# Patient Record
Sex: Female | Born: 1970 | Race: White | Hispanic: No | Marital: Married
Health system: Midwestern US, Community
[De-identification: ages and names within clinical notes are randomized; demographics above are authoritative.]

## PROBLEM LIST (undated history)

## (undated) DIAGNOSIS — Z8489 Family history of other specified conditions: Secondary | ICD-10-CM

## (undated) DIAGNOSIS — I10 Essential (primary) hypertension: Secondary | ICD-10-CM

## (undated) HISTORY — PX: COLONOSCOPY: SHX174

---

## 1997-12-24 ENCOUNTER — Other Ambulatory Visit: Admission: RE | Admit: 1997-12-24 | Discharge: 1997-12-24 | Payer: Self-pay | Admitting: *Deleted

## 1999-01-20 ENCOUNTER — Other Ambulatory Visit: Admission: RE | Admit: 1999-01-20 | Discharge: 1999-01-20 | Payer: Self-pay | Admitting: *Deleted

## 2000-03-07 ENCOUNTER — Other Ambulatory Visit: Admission: RE | Admit: 2000-03-07 | Discharge: 2000-03-07 | Payer: Self-pay | Admitting: *Deleted

## 2001-04-02 ENCOUNTER — Other Ambulatory Visit: Admission: RE | Admit: 2001-04-02 | Discharge: 2001-04-02 | Payer: Self-pay | Admitting: *Deleted

## 2002-04-23 ENCOUNTER — Other Ambulatory Visit: Admission: RE | Admit: 2002-04-23 | Discharge: 2002-04-23 | Payer: Self-pay | Admitting: *Deleted

## 2003-05-01 ENCOUNTER — Other Ambulatory Visit: Admission: RE | Admit: 2003-05-01 | Discharge: 2003-05-01 | Payer: Self-pay | Admitting: *Deleted

## 2004-06-02 ENCOUNTER — Other Ambulatory Visit: Admission: RE | Admit: 2004-06-02 | Discharge: 2004-06-02 | Payer: Self-pay | Admitting: *Deleted

## 2006-06-12 ENCOUNTER — Other Ambulatory Visit: Admission: RE | Admit: 2006-06-12 | Discharge: 2006-06-12 | Payer: Self-pay | Admitting: *Deleted

## 2006-06-23 ENCOUNTER — Encounter: Admission: RE | Admit: 2006-06-23 | Discharge: 2006-06-23 | Payer: Self-pay | Admitting: *Deleted

## 2007-11-27 IMAGING — CT CT ABDOMEN W/ CM
2 of 5 series · 16 of 46 positions shown, 18 images · IV contrast (READICAT/WATER & [ID] OMNI 300)
Comparison: No prior exam.

CLINICAL DATA: Left ovarian mass seen on ultrasound.
TECHNIQUE: Multidetector CT imaging of the abdomen and pelvis was performed
following the standard protocol during bolus administration of intravenous
contrast.

Contrast:  125 cc Omnipaque 300

[Series 3: routine abdomen · axial · 0.78mm/px · z∈[-382,-6]mm · 13 of 82 slices shown, 15 images]
[im 5/82  soft-tissue]
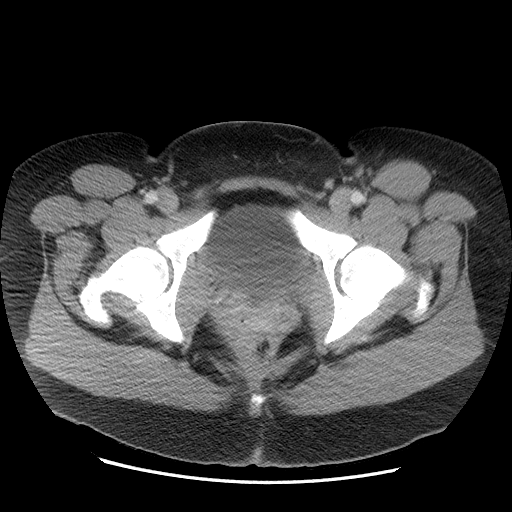
[im 5/82  bone]
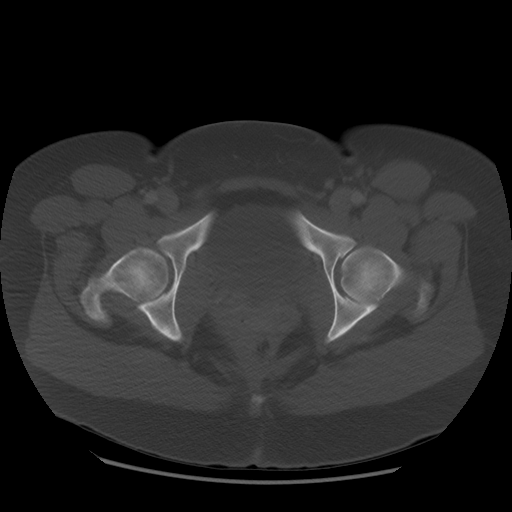
[im 10/82  soft-tissue]
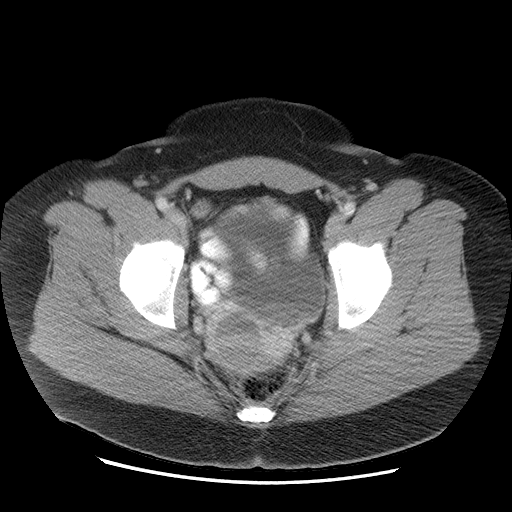
[im 19/82  soft-tissue]
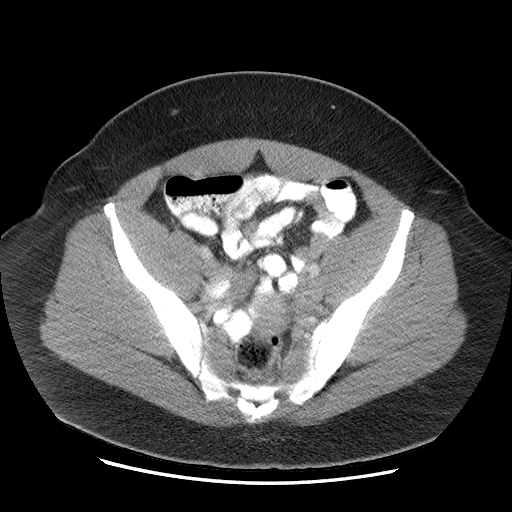
[im 23/82  soft-tissue]
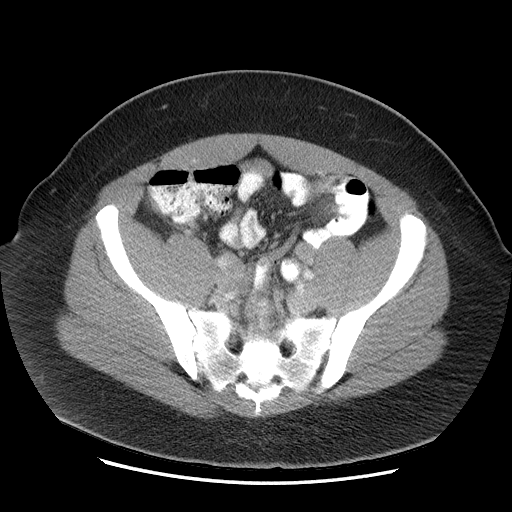
[im 28/82  soft-tissue]
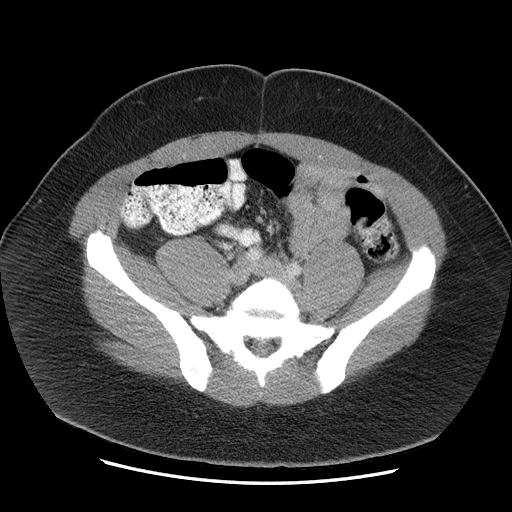
[im 37/82  soft-tissue]
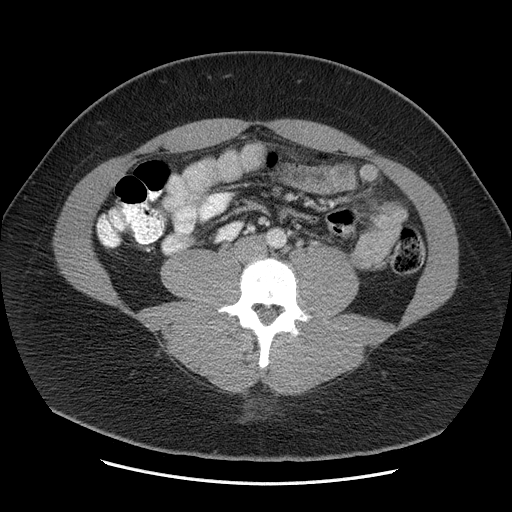
[im 41/82  soft-tissue]
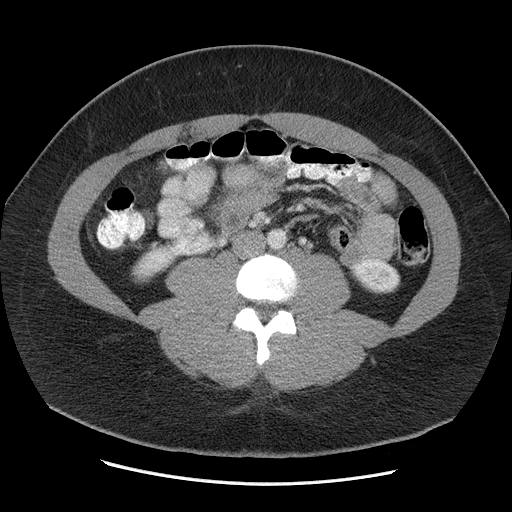
[im 46/82  soft-tissue]
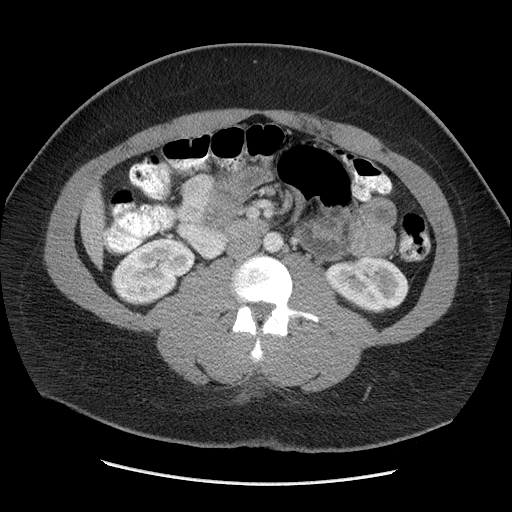
[im 55/82  soft-tissue]
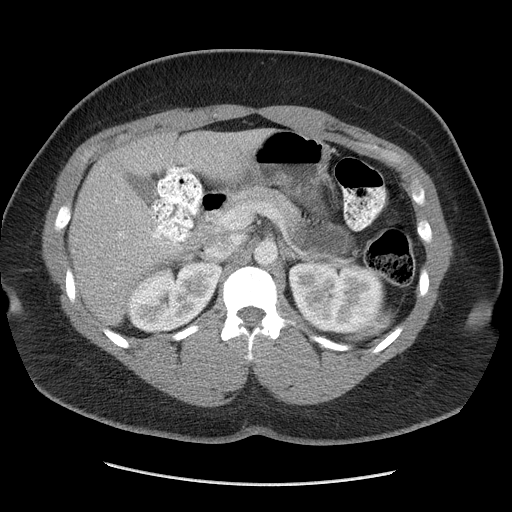
[im 55/82  bone]
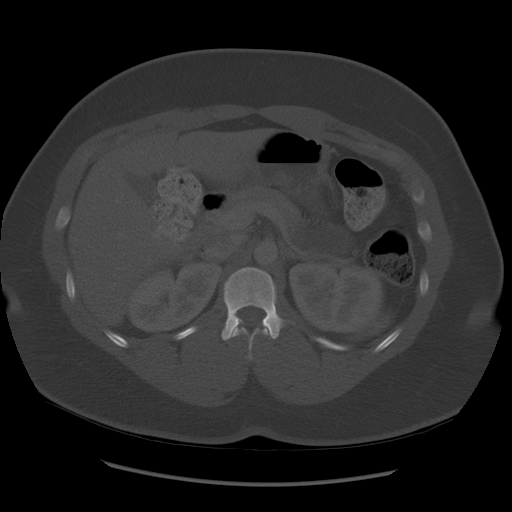
[im 59/82  soft-tissue]
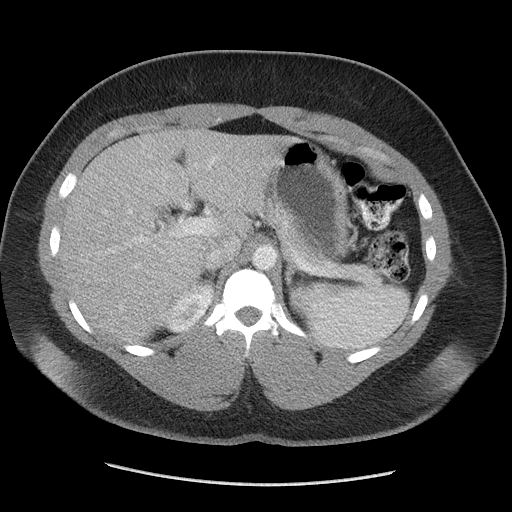
[im 64/82  soft-tissue]
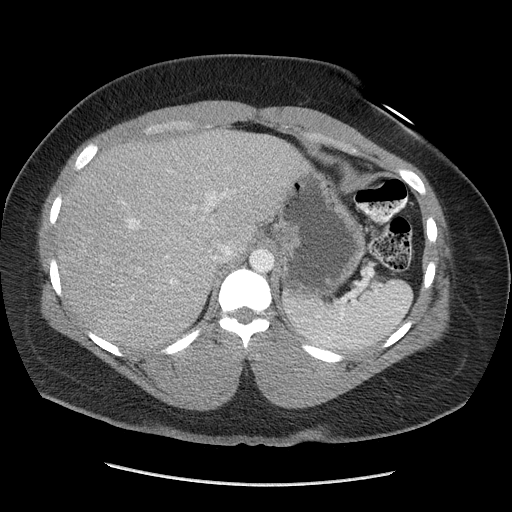
[im 73/82  soft-tissue]
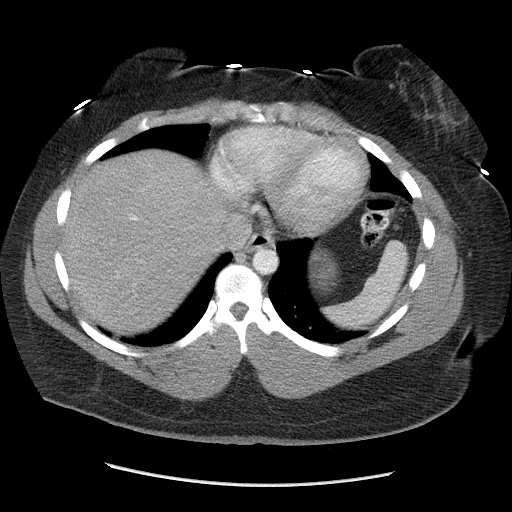
[im 77/82  soft-tissue]
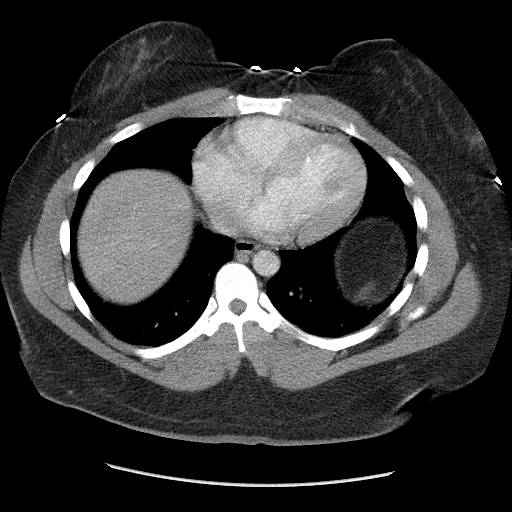

[Series 602: sagittal body · sagittal · 0.90mm/px · 3 of 161 slices shown]
[im 54/161  soft-tissue]
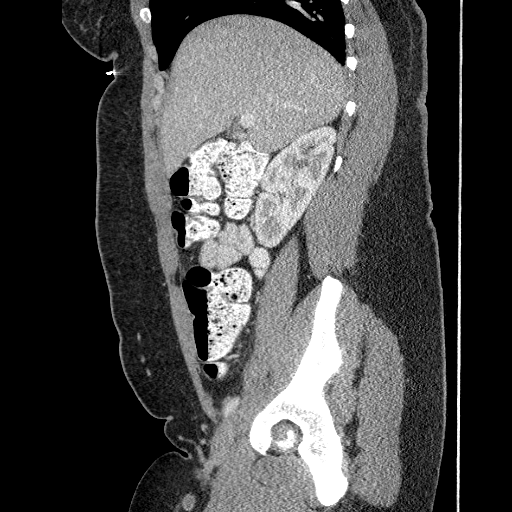
[im 72/161  soft-tissue]
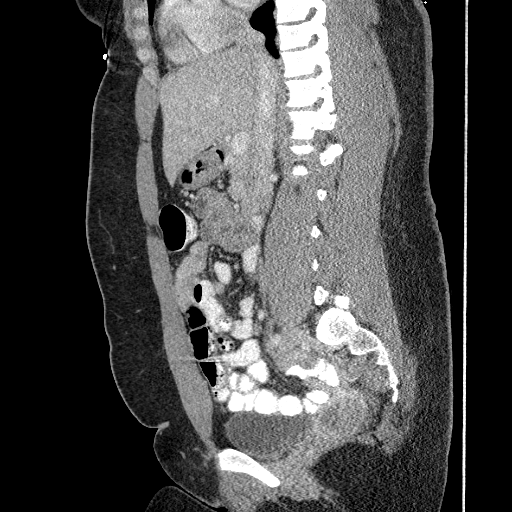
[im 89/161  soft-tissue]
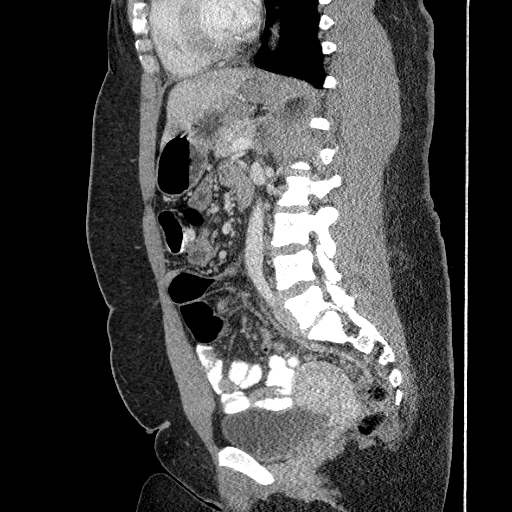

[16 of 46 positions shown; findings below may reference images not displayed]

The ultrasound in question is not available for
review.

ABDOMEN CT WITH CONTRAST:
FINDINGS: Approximately 5 minutes after the bolus infusion of intravenous contrast, the
patient developed hives on her face. Otherwise she was asymptomatic, without
wheezing or subjective sensation of lytic or tongue swelling. Physical exam
revealed no evidence for wheezing, tachypnea, or fascial edema. The patient was
observed for 30 minutes in the [HOSPITAL] and there was no progression
of the hives. She remained asymptomatic from a respiratory standpoint. The
patient was allowed to proceeded home and advised that 50 mg of p.o. Benadryl at
home could further ameliorate symptoms. 

The liver, spleen, stomach, duodenum, pancreas, gallbladder, adrenal glands, and
left kidney have normal imaging features. A 1.8 cm low-density lesion in the
upper pole of the right kidney on the portal venous phase study demonstrates
layering excreted contrast on the renal delay series, consistent with the
calyces diverticulum.

There is no abdominal lymphadenopathy. No abdominal aortic aneurysm. No
intraperitoneal free fluid.
IMPRESSION: Development of hives on her left cheek after IV contrast menstruation. This is
compatible with a demonstration of IV contrast allergy.

Right calyceal diverticulum in the kidney.

PELVIS CT WITH CONTRAST:
FINDINGS: A 3.9 cm cystic lesion is identified in the anatomic pelvis, just posterior to
the uterus. There is apparent layering high attenuation material in the
dependent portion of this cyst. The patient's right ovary actually appears
anterior and cranial to this, seen on image 66. Based on the course of the
gonadal vessels, the left ovary appears to the against the left pelvic sidewall
on image 70.

No evidence for pelvic side wall lymphadenopathy. No intraperitoneal free fluid.
The terminal ileum is unremarkable. The appendix is not visualized.
IMPRESSION: 3.9 cm complex cystic lesion is identified in the right aspect of the
cul-de-sac, in the low anatomic pelvis. This seems more caudal than would be
expected for the ovary and I think I can see the ovary distinct from this
structure higher in the right anatomic pelvis. By CT imaging this appears to be
in the right aspect of the cervix. As such, it may be a complex nabothian cyst.
Close correlation with the ultrasound exam is recommended to determine whether
or not the patient's right ovary was normal by ultrasound. 

If this is not a lesion which can be followed easily by ultrasound or physical
exam, or if additional imaging characterization is warranted, a pelvic MRI would
be the study of choice (and is much better than CT) for evaluation of adnexal,
uterine, and cervical anatomy.

## 2008-08-14 LAB — POC CHEM8
Anion gap (POC): 19 — ABNORMAL HIGH (ref 5–15)
BUN (POC): 15 MG/DL (ref 9–20)
CO2 (POC): 21 MMOL/L (ref 21–32)
Calcium, ionized (POC): 1.2 MMOL/L (ref 1.12–1.32)
Chloride (POC): 102 MMOL/L (ref 98–107)
Creatinine (POC): 0.8 MG/DL (ref 0.6–1.3)
GFRAA, POC: >60 mL/min/{1.73_m2} (ref >60)
GFRNA, POC: >60 mL/min/{1.73_m2} (ref >60)
Glucose (POC): 143 MG/DL — ABNORMAL HIGH (ref 65–105)
Hematocrit (POC): 46 % (ref 35.0–47.0)
Hemoglobin (POC): 15.6 GM/DL (ref 11.5–16.0)
Potassium (POC): 4.1 MMOL/L (ref 3.5–5.1)
Sodium (POC): 137 MMOL/L (ref 137–145)

## 2008-08-14 LAB — URINALYSIS W/ REFLEX CULTURE
Bilirubin: NEGATIVE
Epithelial cells: >100 /LPF — ABNORMAL HIGH (ref 0–5)
Glucose: NEGATIVE MG/DL
Ketone: NEGATIVE MG/DL
Nitrites: NEGATIVE
Specific gravity: 1.029 (ref 1.003–1.030)
Urobilinogen: 0.2 EU/DL (ref 0.2–1.0)
pH (UA): 5.5 (ref 5.0–8.0)

## 2008-08-16 LAB — CULTURE, URINE
Colonies Counted: >100000
Colony Count: >100000

## 2012-07-13 NOTE — ED Notes (Signed)
Pt states she has swelling to LLE and pain from calf to knee; pt is a school bus driver.  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

## 2012-07-13 NOTE — ED Notes (Signed)
Patient complains of shooting pain in left leg, ankle swelling, and shortness of breath over the past few days.

## 2012-07-13 NOTE — ED Provider Notes (Signed)
HPI Comments:  Pt states she has swelling to LLE and pain from calf to knee; pt is a school bus driver.  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 Patient denies chest pain, denies any recent long car ride or plane flight. Patient is smoker.    Patient is a 42 y.o. female presenting with ankle swelling and shortness of breath. The history is provided by the patient.   Ankle swelling   This is a new problem. The current episode started more than 1 week ago. The problem occurs constantly. The problem has not changed since onset.The pain is present in the left lower leg and right lower leg. The pain is at a severity of 3/10. Pertinent negatives include no numbness, full range of motion, no stiffness, no tingling, no itching, no back pain and no neck pain. She has tried nothing for the symptoms. There has been no history of extremity trauma.   Shortness of Breath  This is a new problem. The problem has not changed since onset.Associated symptoms include leg pain and leg swelling. Pertinent negatives include no neck pain, no wheezing and no chest pain. She has tried nothing for the symptoms. She has had no prior hospitalizations. She has had no prior ED visits. She has had no prior ICU admissions.        History reviewed. No pertinent past medical history.     History reviewed. No pertinent past surgical history.      History reviewed. No pertinent family history.     History     Social History   ??? Marital Status: MARRIED     Spouse Name: N/A     Number of Children: N/A   ??? Years of Education: N/A     Occupational History   ??? Not on file.     Social History Main Topics   ??? Smoking status: Current Every Day Smoker   ??? Smokeless tobacco: Not on file   ??? Alcohol Use: Yes      Comment: occasionally   ??? Drug Use: No   ??? Sexually Active: Not on file     Other Topics Concern   ??? Not on file     Social History Narrative   ??? No narrative on file                  ALLERGIES: Review of patient's allergies indicates no known allergies.       Review of Systems   Constitutional: Negative.    HENT: Negative.  Negative for neck pain.    Eyes: Negative.    Respiratory: Positive for shortness of breath. Negative for wheezing.    Cardiovascular: Positive for leg swelling. Negative for chest pain.   Gastrointestinal: Negative.    Endocrine: Negative.    Genitourinary: Negative.    Musculoskeletal: Negative.  Negative for back pain and stiffness.   Skin: Negative.  Negative for itching.   Allergic/Immunologic: Negative.    Neurological: Negative.  Negative for tingling and numbness.   Hematological: Negative.    Psychiatric/Behavioral: Negative.    All other systems reviewed and are negative.        Filed Vitals:    07/13/12 2237   BP: 151/95   Pulse: 102   Temp: 97.9 ??F (36.6 ??C)   Resp: 20   Height: 5\' 6"  (1.676 m)   Weight: 86.183 kg (190 lb)   SpO2: 100%            Physical Exam   Nursing  note and vitals reviewed.  Constitutional: She is oriented to person, place, and time. She appears well-developed and well-nourished.   HENT:   Head: Normocephalic and atraumatic.   Right Ear: External ear normal.   Left Ear: External ear normal.   Mouth/Throat: Oropharynx is clear and moist. No oropharyngeal exudate.   Eyes: Conjunctivae and EOM are normal. Pupils are equal, round, and reactive to light. Right eye exhibits no discharge. Left eye exhibits no discharge. No scleral icterus.   Neck: Normal range of motion. No tracheal deviation present. No thyromegaly present.   Cardiovascular: Regular rhythm and normal heart sounds.  Tachycardia present.    No murmur heard.  Pulmonary/Chest: Effort normal and breath sounds normal. No respiratory distress. She has no wheezes. She has no rales. She exhibits no tenderness.   Abdominal: Soft. Bowel sounds are normal. She exhibits no distension. There is no tenderness. There is no rebound and no guarding.   Musculoskeletal: Normal range of motion. She exhibits no edema and no tenderness.   Bilateral lower extremity 1+edema.  No erythema noted.   Lymphadenopathy:     She has no cervical adenopathy.   Neurological: She is alert and oriented to person, place, and time. No cranial nerve deficit. Coordination normal.   Skin: Skin is warm. No erythema.   Psychiatric: She has a normal mood and affect. Her behavior is normal. Judgment and thought content normal.        MDM    Procedures

## 2012-07-14 LAB — URINALYSIS W/ REFLEX CULTURE
Bacteria: NEGATIVE /hpf
Bilirubin: NEGATIVE
Glucose: NEGATIVE mg/dL
Ketone: NEGATIVE mg/dL
Leukocyte Esterase: NEGATIVE
Nitrites: NEGATIVE
Protein: NEGATIVE mg/dL
Specific gravity: 1.022 (ref 1.003–1.030)
Urobilinogen: 0.2 EU/dL (ref 0.2–1.0)
pH (UA): 5.5 (ref 5.0–8.0)

## 2012-07-14 LAB — CBC WITH AUTOMATED DIFF
ABS. BASOPHILS: 0 10*3/uL (ref 0.0–0.1)
ABS. EOSINOPHILS: 0.1 10*3/uL (ref 0.0–0.4)
ABS. LYMPHOCYTES: 4.4 10*3/uL — ABNORMAL HIGH (ref 0.8–3.5)
ABS. MONOCYTES: 0.7 10*3/uL (ref 0.0–1.0)
ABS. NEUTROPHILS: 9.1 10*3/uL — ABNORMAL HIGH (ref 1.8–8.0)
BASOPHILS: 0 % (ref 0–1)
EOSINOPHILS: 1 % (ref 0–7)
HCT: 46.4 % (ref 35.0–47.0)
HGB: 16.3 g/dL — ABNORMAL HIGH (ref 11.5–16.0)
LYMPHOCYTES: 31 % (ref 12–49)
MCH: 32.3 PG (ref 26.0–34.0)
MCHC: 35.1 g/dL (ref 30.0–36.5)
MCV: 92.1 FL (ref 80.0–99.0)
MONOCYTES: 5 % (ref 5–13)
NEUTROPHILS: 63 % (ref 32–75)
PLATELET: 268 10*3/uL (ref 150–400)
RBC: 5.04 M/uL (ref 3.80–5.20)
RDW: 14.1 % (ref 11.5–14.5)
WBC: 14.2 10*3/uL — ABNORMAL HIGH (ref 3.6–11.0)

## 2012-07-14 LAB — POC CHEM8
Anion gap (POC): 16 mmol/L — ABNORMAL HIGH (ref 5–15)
BUN (POC): 12 MG/DL (ref 9–20)
CO2 (POC): 24 MMOL/L (ref 21–32)
Calcium, ionized (POC): 1.17 MMOL/L (ref 1.12–1.32)
Chloride (POC): 104 MMOL/L (ref 98–107)
Creatinine (POC): 0.9 MG/DL (ref 0.6–1.3)
GFRAA, POC: >60 mL/min/{1.73_m2} (ref >60)
GFRNA, POC: >60 mL/min/{1.73_m2} (ref >60)
Glucose (POC): 89 MG/DL (ref 65–105)
Hematocrit (POC): 47 % (ref 35.0–47.0)
Hemoglobin (POC): 16 GM/DL (ref 11.5–16.0)
Potassium (POC): 3.7 MMOL/L (ref 3.5–5.1)
Sodium (POC): 140 MMOL/L (ref 136–145)

## 2012-07-14 LAB — NT-PRO BNP: NT pro-BNP: 33 PG/ML (ref 0–125)

## 2012-07-14 LAB — HCG URINE, QL. - POC: Pregnancy test,urine (POC): NEGATIVE

## 2012-07-14 LAB — POC TROPONIN-I: Troponin-I (POC): <0.04 ng/mL (ref 0.00–0.08)

## 2012-07-14 NOTE — ED Provider Notes (Signed)
I have personally seen and evaluated patient. I find the patient's history and physical exam are consistent with the PA's NP documentation. I agree with the care provided, treatments rendered, disposition and follow up plan. Minimal edema noted, no redness or warmth, negative doppler and cxr.  Etiology of swelling unclear but no signs of CHF or DVT and sx not c/w PE.  Discussed need for close follow up especially in light of elevated WBC.

## 2012-07-14 NOTE — ED Notes (Signed)
I have reviewed discharge instructions with the patient.  The patient verbalized understanding.  Pt d/c with instructions on how to care for self at home; all questions answered.  Pt ambulated out of ED with no distress.

## 2012-07-14 NOTE — Procedures (Signed)
[facility]  *** FINAL REPORT ***    Name: Joyce Meyers, Joyce Meyers  MRN: [id number]  DOB: [birth year]  HIS Order #: [id number]  TRAKnet Visit #: [id number]  Date: 13 Jul 2012    TYPE OF TEST: Peripheral Venous Testing    REASON FOR TEST  Limb swelling    Right Leg:-  Deep venous thrombosis:           No  Superficial venous thrombosis:    No  Deep venous insufficiency:        No  Superficial venous insufficiency: No    Left Leg:-  Deep venous thrombosis:           No  Superficial venous thrombosis:    No  Deep venous insufficiency:        No  Superficial venous insufficiency: No      INTERPRETATION/FINDINGS  PROCEDURE:  BILATERAL LE VENOUS DUPLEX.  Evaluation of lower extremity veins with ultrasound (B-mode imaging,  pulsed Doppler, color Doppler).  Includes the common femoral, deep  femoral, femoral, popliteal, posterior tibial, peroneal, and great  saphenous veins.    FINDINGS:  Wallace Cullens scale and color flow duplex images of the veins in  both lower extremities demonstrate normal compressibility, spontaneous   and augmented flow profiles, and absence of filling defects  throughout the deep and superficial veins in both lower extremities.    CONCLUSION:  Bilateral lower extremity venous duplex negative for deep   venous thrombosis or thrombophlebitis.    ADDITIONAL COMMENTS    I have personally reviewed the data relevant to the interpretation of  this  study.    TECHNOLOGIST: Colan Neptune Lily Peer, RVT  Signed: 07/14/2012 12:06 AM    PHYSICIAN: Ross Marcus, MD  Signed: 07/16/2012 08:18 AM

## 2012-07-15 LAB — EKG, 12 LEAD, INITIAL
Atrial Rate: 104 {beats}/min
Calculated P Axis: 56 degrees
Calculated R Axis: -44 degrees
Calculated T Axis: 48 degrees
P-R Interval: 150 ms
Q-T Interval: 378 ms
QRS Duration: 88 ms
QTC Calculation (Bezet): 497 ms
Ventricular Rate: 104 {beats}/min

## 2012-07-16 NOTE — Procedures (Signed)
[facility]  *** FINAL REPORT ***    Name: Meyers, Joyce  MRN: [id number]  DOB: 02 Oct 1970  HIS Order #: [id number]  TRAKnet Visit #: [id number]  Date: 13 Jul 2012    TYPE OF TEST: Peripheral Venous Testing    REASON FOR TEST  Limb swelling    Right Leg:-  Deep venous thrombosis:           No  Superficial venous thrombosis:    No  Deep venous insufficiency:        No  Superficial venous insufficiency: No    Left Leg:-  Deep venous thrombosis:           No  Superficial venous thrombosis:    No  Deep venous insufficiency:        No  Superficial venous insufficiency: No      INTERPRETATION/FINDINGS  PROCEDURE:  BILATERAL LE VENOUS DUPLEX.  Evaluation of lower extremity veins with ultrasound (B-mode imaging,  pulsed Doppler, color Doppler).  Includes the common femoral, deep  femoral, femoral, popliteal, posterior tibial, peroneal, and great  saphenous veins.    FINDINGS:  Gray scale and color flow duplex images of the veins in  both lower extremities demonstrate normal compressibility, spontaneous   and augmented flow profiles, and absence of filling defects  throughout the deep and superficial veins in both lower extremities.    CONCLUSION:  Bilateral lower extremity venous duplex negative for deep   venous thrombosis or thrombophlebitis.    ADDITIONAL COMMENTS    I have personally reviewed the data relevant to the interpretation of  this  study.    TECHNOLOGIST: Elisa S. Fernandez, RVT  Signed: 07/14/2012 12:06 AM    PHYSICIAN: Maelie Chriswell D. Frank Pilger, MD  Signed: 07/16/2012 08:18 AM

## 2012-08-21 ENCOUNTER — Other Ambulatory Visit: Payer: Self-pay | Admitting: Family Medicine

## 2012-08-21 DIAGNOSIS — R222 Localized swelling, mass and lump, trunk: Secondary | ICD-10-CM

## 2012-09-05 ENCOUNTER — Other Ambulatory Visit: Payer: Self-pay | Admitting: Family Medicine

## 2012-09-05 DIAGNOSIS — R222 Localized swelling, mass and lump, trunk: Secondary | ICD-10-CM

## 2016-01-25 DIAGNOSIS — I10 Essential (primary) hypertension: Secondary | ICD-10-CM | POA: Diagnosis not present

## 2016-02-01 DIAGNOSIS — I10 Essential (primary) hypertension: Secondary | ICD-10-CM | POA: Diagnosis not present

## 2016-03-07 DIAGNOSIS — L219 Seborrheic dermatitis, unspecified: Secondary | ICD-10-CM | POA: Diagnosis not present

## 2016-03-23 DIAGNOSIS — Z01419 Encounter for gynecological examination (general) (routine) without abnormal findings: Secondary | ICD-10-CM | POA: Diagnosis not present

## 2016-03-23 DIAGNOSIS — Z1231 Encounter for screening mammogram for malignant neoplasm of breast: Secondary | ICD-10-CM | POA: Diagnosis not present

## 2016-03-23 DIAGNOSIS — Z6841 Body Mass Index (BMI) 40.0 and over, adult: Secondary | ICD-10-CM | POA: Diagnosis not present

## 2017-01-25 DIAGNOSIS — I10 Essential (primary) hypertension: Secondary | ICD-10-CM | POA: Diagnosis not present

## 2017-01-25 DIAGNOSIS — R7301 Impaired fasting glucose: Secondary | ICD-10-CM | POA: Diagnosis not present

## 2017-01-25 DIAGNOSIS — Z6841 Body Mass Index (BMI) 40.0 and over, adult: Secondary | ICD-10-CM | POA: Diagnosis not present

## 2017-01-25 DIAGNOSIS — N182 Chronic kidney disease, stage 2 (mild): Secondary | ICD-10-CM | POA: Diagnosis not present

## 2017-02-11 ENCOUNTER — Inpatient Hospital Stay
Admit: 2017-02-11 | Discharge: 2017-02-11 | Disposition: A | Discharge status: Home / Self Care | Payer: PRIVATE HEALTH INSURANCE | Attending: Student in an Organized Health Care Education/Training Program

## 2017-02-11 DIAGNOSIS — E860 Dehydration: Secondary | ICD-10-CM

## 2017-02-11 LAB — CBC WITH AUTOMATED DIFF
ABS. BASOPHILS: 0.1 10*3/uL (ref 0.0–0.1)
ABS. EOSINOPHILS: 0 10*3/uL (ref 0.0–0.4)
ABS. IMM. GRANS.: 0 10*3/uL
ABS. LYMPHOCYTES: 3.1 10*3/uL (ref 0.8–3.5)
ABS. MONOCYTES: 0.3 10*3/uL (ref 0.0–1.0)
ABS. NEUTROPHILS: 3.2 10*3/uL (ref 1.8–8.0)
ABSOLUTE NRBC: 0 10*3/uL (ref 0.00–0.01)
BASOPHILS: 1 % (ref 0–1)
EOSINOPHILS: 0 % (ref 0–7)
HCT: 35.9 % (ref 35.0–47.0)
HGB: 12.1 g/dL (ref 11.5–16.0)
IMMATURE GRANULOCYTES: 0 %
LYMPHOCYTES: 46 % (ref 12–49)
MCH: 31.2 PG (ref 26.0–34.0)
MCHC: 33.7 g/dL (ref 30.0–36.5)
MCV: 92.5 FL (ref 80.0–99.0)
METAMYELOCYTES: 1 % — ABNORMAL HIGH
MONOCYTES: 5 % (ref 5–13)
MPV: 10.9 FL (ref 8.9–12.9)
NEUTROPHILS: 47 % (ref 32–75)
NRBC: 0 PER 100 WBC
PLATELET: 160 10*3/uL (ref 150–400)
RBC: 3.88 M/uL (ref 3.80–5.20)
RDW: 13.9 % (ref 11.5–14.5)
WBC: 6.8 10*3/uL (ref 3.6–11.0)

## 2017-02-11 LAB — METABOLIC PANEL, COMPREHENSIVE
A-G Ratio: 1.1 (ref 1.1–2.2)
ALT (SGPT): 41 U/L (ref 12–78)
AST (SGOT): 11 U/L — ABNORMAL LOW (ref 15–37)
Albumin: 3.5 g/dL (ref 3.5–5.0)
Alk. phosphatase: 88 U/L (ref 45–117)
Anion gap: 9 mmol/L (ref 5–15)
BUN/Creatinine ratio: 8 — ABNORMAL LOW (ref 12–20)
BUN: 7 MG/DL (ref 6–20)
Bilirubin, total: 0.2 MG/DL (ref 0.2–1.0)
CO2: 27 mmol/L (ref 21–32)
Calcium: 9.1 MG/DL (ref 8.5–10.1)
Chloride: 104 mmol/L (ref 97–108)
Creatinine: 0.86 MG/DL (ref 0.55–1.02)
GFR est AA: >60 mL/min/{1.73_m2} (ref >60)
GFR est non-AA: >60 mL/min/{1.73_m2} (ref >60)
Globulin: 3.2 g/dL (ref 2.0–4.0)
Glucose: 98 mg/dL (ref 65–100)
Potassium: 3.7 mmol/L (ref 3.5–5.1)
Protein, total: 6.7 g/dL (ref 6.4–8.2)
Sodium: 140 mmol/L (ref 136–145)

## 2017-02-11 LAB — SAMPLES BEING HELD

## 2017-02-11 LAB — LIPASE: Lipase: 120 U/L (ref 73–393)

## 2017-02-11 LAB — MAGNESIUM: Magnesium: 1.9 mg/dL (ref 1.6–2.4)

## 2017-02-11 MED ORDER — ALUM-MAG HYDROXIDE-SIMETH 200 MG-200 MG-20 MG/5 ML ORAL SUSP
200-200-20 mg/5 mL | Freq: Once | ORAL | Status: AC
Start: 2017-02-11 — End: 2017-02-11
  Administered 2017-02-11: 15:00:00 via ORAL

## 2017-02-11 MED ORDER — KETOROLAC TROMETHAMINE 30 MG/ML INJECTION
30 mg/mL (1 mL) | INTRAMUSCULAR | Status: AC
Start: 2017-02-11 — End: 2017-02-11
  Administered 2017-02-11: 16:00:00 via INTRAVENOUS

## 2017-02-11 MED ORDER — ALUM-MAG HYDROXIDE-SIMETH 200 MG-200 MG-20 MG/5 ML ORAL SUSP
200-200-205 mg/5 mL | Freq: Once | ORAL | Status: AC
Start: 2017-02-11 — End: 2017-02-11
  Administered 2017-02-11: 17:00:00 via ORAL

## 2017-02-11 MED ORDER — SODIUM CHLORIDE 0.9% BOLUS IV
0.9 % | Freq: Once | INTRAVENOUS | Status: AC
Start: 2017-02-11 — End: 2017-02-11
  Administered 2017-02-11: 15:00:00 via INTRAVENOUS

## 2017-02-11 MED ORDER — KETOROLAC TROMETHAMINE 30 MG/ML INJECTION
30 mg/mL (1 mL) | INTRAMUSCULAR | Status: AC
Start: 2017-02-11 — End: 2017-02-11
  Administered 2017-02-11: 17:00:00 via INTRAVENOUS

## 2017-02-11 MED ORDER — FLUCONAZOLE 100 MG TAB
100 mg | ORAL | Status: AC
Start: 2017-02-11 — End: 2017-02-11
  Administered 2017-02-11: 17:00:00 via ORAL

## 2017-02-11 MED FILL — KETOROLAC TROMETHAMINE 30 MG/ML INJECTION: 30 mg/mL (1 mL) | INTRAMUSCULAR | Qty: 1

## 2017-02-11 MED FILL — MAG-AL PLUS 200 MG-200 MG-20 MG/5 ML ORAL SUSPENSION: 200-200-20 mg/5 mL | ORAL | Qty: 30

## 2017-02-11 MED FILL — FLUCONAZOLE 100 MG TAB: 100 mg | ORAL | Qty: 2

## 2017-02-11 MED FILL — SODIUM CHLORIDE 0.9 % IV: INTRAVENOUS | Qty: 1000

## 2017-02-11 NOTE — ED Notes (Signed)
The patient states she continues to experience choking sensation in the throat, able to tolerate PO fluids without difficulty.

## 2017-02-11 NOTE — ED Notes (Signed)
The patient is tolerating PO fluids well.

## 2017-02-11 NOTE — ED Provider Notes (Signed)
46 y.o. 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 Pt reports that she was recently diagnosed with stage II breast CA, and underwent her second chemotherapy treatment on 02/02/17. She states that she started to feel what she believes to be side effects of the chemotherapy this week, including sore throat and difficulty swallowing. Pt reports that she has thrush which has caused her throat to feel "raw". Notes that she has had trouble eating and drinking water due to pain with swallowing, reporting that it "feels like I am choking". Pt states that she feels dehydrated. She reports that she has been using Nystatin mouthwash, but notes that it provides no significant relief and has caused her to vomit on one occasion. Denies changing her toothbrush since onset of sore throat. Pt also complains of bilateral leg (thigh) pain, describing her pain as "aching" in quality and stating "they feel like Jello". Denies any calf tenderness. She rates her current pain as 4/10 in intensity. Pt reports that she is s/p double mastectomy, and is on Keflex in preparation for breast reconstruction.  There are no other acute medical concerns at this time.    Social hx: Positive for Tobacco use (current every day smoker); Positive for EtOH use (occasionally); Negative for Illicit Drug use  PCP: Otilio Carpenabhi, Kishor S, MD  Oncology: Courtney ParisGoble, Sharon A, MD    Past Medical History:  09/2016: Cancer [facility](HCC)      Comment:  Breast cancer    Past Surgical History:  11/2016: BREAST SURGERY PROCEDURE UNLISTED      Comment:  bilateral breast        Note written by Myrle ShengAndrew Azdell, Scribe, as dictated by Juanda Chanceichio, Jamier Urbas M, NP 9:23 AM      The history is provided by the patient and medical records. No language interpreter was used.        No past medical history on file.    No past surgical history on file.       No family history on file.    Social History     Socioeconomic History   ??? Marital status: MARRIED     Spouse name: Not on file   ??? Number of children: Not on file   ??? Years of education: Not on file   ??? Highest education level: Not on file   Social Needs   ??? Financial resource strain: Not on file   ??? Food insecurity - worry: Not on file   ??? Food insecurity - inability: Not on file   ??? Transportation needs - medical: Not on file   ??? Transportation needs - non-medical: Not on file   Occupational History   ??? Not on file   Tobacco Use   ??? Smoking status: Current Every Day Smoker   Substance and Sexual Activity   ??? Alcohol use: Yes     Comment: occasionally   ??? Drug use: No   ??? Sexual activity: Not on file   Other Topics Concern   ??? Not on file   Social History Narrative   ??? Not on file         ALLERGIES: Patient has no known allergies.    Review of Systems   Constitutional: Positive for appetite change (unable to swallow due to pain). Negative for chills, diaphoresis, fatigue and fever.   HENT: Positive for sore throat and trouble swallowing.  Negative for congestion, ear discharge, ear pain, sinus pressure and sinus pain.    Eyes: Negative for photophobia, pain, redness and visual disturbance.   Respiratory: Positive for choking. Negative for chest tightness, shortness of breath and wheezing.    Cardiovascular: Negative for chest pain and palpitations.   Gastrointestinal: Negative for abdominal distention, abdominal pain, nausea and vomiting.   Endocrine: Negative.    Genitourinary: Negative for difficulty urinating, flank pain, frequency and urgency.   Musculoskeletal: Positive for myalgias (bilateral lower extremities). Negative for back pain, neck pain and neck stiffness.   Skin: Negative for color change, pallor, rash and wound.   Allergic/Immunologic: Negative.    Neurological: Negative for dizziness, speech difficulty, weakness and headaches.   Hematological: Does not bruise/bleed easily.    Psychiatric/Behavioral: Negative for behavioral problems. The patient is not nervous/anxious.    All other systems reviewed and are negative.      Vitals:    02/11/17 0916   BP: 129/72   Pulse: 87   Resp: 18   Temp: 97.8 ??F (36.6 ??C)   SpO2: 97%   Weight: 112.9 kg (249 lb)   Height: 5' 6.5" (1.689 m)            Physical Exam   Constitutional: She is oriented to person, place, and time. She appears well-developed and well-nourished. No distress.   HENT:   Head: Normocephalic and atraumatic.   Right Ear: External ear normal.   Left Ear: External ear normal.   Nose: Nose normal.   White coating in mouth consistent with thrush   Eyes: Conjunctivae and EOM are normal. Pupils are equal, round, and reactive to light. Right eye exhibits no discharge. Left eye exhibits no discharge.   Neck: Normal range of motion. Neck supple. No JVD present. No tracheal deviation present.   Cardiovascular: Normal rate, regular rhythm, normal heart sounds and intact distal pulses. Exam reveals no gallop.   No murmur heard.  Pulmonary/Chest: Effort normal and breath sounds normal. No respiratory distress. She has no wheezes. She has no rales. She exhibits no tenderness.   Abdominal: Soft. Bowel sounds are normal. She exhibits no distension. There is no tenderness. There is no rebound and no guarding.   Genitourinary:   Genitourinary Comments: Negative     Musculoskeletal: Normal range of motion. She exhibits no edema or tenderness.   Neurological: She is alert and oriented to person, place, and time.   Skin: Skin is warm and dry. No rash noted. No erythema. No pallor.   Psychiatric: She has a normal mood and affect. Her behavior is normal. Judgment and thought content normal.   Nursing note and vitals reviewed.       MDM  Number of Diagnoses or Management Options  Dehydration: new and requires workup  Muscle pain: new and requires workup  Thrush of mouth and esophagus [facility](HCC): new and requires workup  Diagnosis management comments: Plan:   Discharge to home and follow up with PCP. Follow up with oncologist.  Return to the ed with worsening symptoms.         Procedures    PROGRESS NOTE:  10:24 AM  Patient was reevaluated at this time. Pt complaining of bilateral leg pain. Will medicate with Toradol.    11:22 AM  Patient's results have been reviewed with them.  Patient and/or family have verbally conveyed their understanding and agreement of the patient's signs, symptoms, diagnosis, treatment and prognosis and additionally agree to follow up as recommended or return to  the Emergency Room should their condition change prior to follow-up.  Discharge instructions have also been provided to the patient with some educational information regarding their diagnosis as well a list of reasons why they would want to return to the ER prior to their follow-up appointment should their condition change.

## 2017-02-11 NOTE — ED Triage Notes (Signed)
Has had thrush, on second treatment of chemo. Having burning, and hard to swallow. C/O both legs aching and also feels like she is having a problem in pelvic area also with a bump and soreness. Feeling dehydrated because unable to swallow well.

## 2017-04-13 DIAGNOSIS — Z01419 Encounter for gynecological examination (general) (routine) without abnormal findings: Secondary | ICD-10-CM | POA: Diagnosis not present

## 2017-04-13 DIAGNOSIS — Z6841 Body Mass Index (BMI) 40.0 and over, adult: Secondary | ICD-10-CM | POA: Diagnosis not present

## 2017-04-13 DIAGNOSIS — Z1231 Encounter for screening mammogram for malignant neoplasm of breast: Secondary | ICD-10-CM | POA: Diagnosis not present

## 2017-04-25 DIAGNOSIS — R7303 Prediabetes: Secondary | ICD-10-CM | POA: Diagnosis not present

## 2017-04-25 DIAGNOSIS — I10 Essential (primary) hypertension: Secondary | ICD-10-CM | POA: Diagnosis not present

## 2017-04-25 DIAGNOSIS — N182 Chronic kidney disease, stage 2 (mild): Secondary | ICD-10-CM | POA: Diagnosis not present

## 2017-04-28 DIAGNOSIS — D251 Intramural leiomyoma of uterus: Secondary | ICD-10-CM | POA: Diagnosis not present

## 2017-04-28 DIAGNOSIS — N852 Hypertrophy of uterus: Secondary | ICD-10-CM | POA: Diagnosis not present

## 2017-11-24 DIAGNOSIS — R7303 Prediabetes: Secondary | ICD-10-CM | POA: Diagnosis not present

## 2017-11-24 DIAGNOSIS — N182 Chronic kidney disease, stage 2 (mild): Secondary | ICD-10-CM | POA: Diagnosis not present

## 2017-11-24 DIAGNOSIS — Z136 Encounter for screening for cardiovascular disorders: Secondary | ICD-10-CM | POA: Diagnosis not present

## 2017-11-24 DIAGNOSIS — Z6841 Body Mass Index (BMI) 40.0 and over, adult: Secondary | ICD-10-CM | POA: Diagnosis not present

## 2017-11-24 DIAGNOSIS — I10 Essential (primary) hypertension: Secondary | ICD-10-CM | POA: Diagnosis not present

## 2018-02-27 DIAGNOSIS — L218 Other seborrheic dermatitis: Secondary | ICD-10-CM | POA: Diagnosis not present

## 2018-03-28 DIAGNOSIS — R7303 Prediabetes: Secondary | ICD-10-CM | POA: Diagnosis not present

## 2018-03-28 DIAGNOSIS — I10 Essential (primary) hypertension: Secondary | ICD-10-CM | POA: Diagnosis not present

## 2018-03-28 DIAGNOSIS — N182 Chronic kidney disease, stage 2 (mild): Secondary | ICD-10-CM | POA: Diagnosis not present

## 2018-05-03 DIAGNOSIS — Z1231 Encounter for screening mammogram for malignant neoplasm of breast: Secondary | ICD-10-CM | POA: Diagnosis not present

## 2018-05-03 DIAGNOSIS — Z6841 Body Mass Index (BMI) 40.0 and over, adult: Secondary | ICD-10-CM | POA: Diagnosis not present

## 2018-05-03 DIAGNOSIS — Z01419 Encounter for gynecological examination (general) (routine) without abnormal findings: Secondary | ICD-10-CM | POA: Diagnosis not present

## 2018-07-25 DIAGNOSIS — R7303 Prediabetes: Secondary | ICD-10-CM | POA: Diagnosis not present

## 2018-07-25 DIAGNOSIS — N182 Chronic kidney disease, stage 2 (mild): Secondary | ICD-10-CM | POA: Diagnosis not present

## 2018-07-25 DIAGNOSIS — I10 Essential (primary) hypertension: Secondary | ICD-10-CM | POA: Diagnosis not present

## 2018-12-14 DIAGNOSIS — I10 Essential (primary) hypertension: Secondary | ICD-10-CM | POA: Diagnosis not present

## 2018-12-14 DIAGNOSIS — N182 Chronic kidney disease, stage 2 (mild): Secondary | ICD-10-CM | POA: Diagnosis not present

## 2018-12-14 DIAGNOSIS — R7303 Prediabetes: Secondary | ICD-10-CM | POA: Diagnosis not present

## 2019-05-30 DIAGNOSIS — I10 Essential (primary) hypertension: Secondary | ICD-10-CM | POA: Diagnosis not present

## 2019-05-30 DIAGNOSIS — N182 Chronic kidney disease, stage 2 (mild): Secondary | ICD-10-CM | POA: Diagnosis not present

## 2019-05-30 DIAGNOSIS — R7303 Prediabetes: Secondary | ICD-10-CM | POA: Diagnosis not present

## 2019-06-04 DIAGNOSIS — Z01419 Encounter for gynecological examination (general) (routine) without abnormal findings: Secondary | ICD-10-CM | POA: Diagnosis not present

## 2019-06-04 DIAGNOSIS — Z6841 Body Mass Index (BMI) 40.0 and over, adult: Secondary | ICD-10-CM | POA: Diagnosis not present

## 2019-06-04 DIAGNOSIS — Z1231 Encounter for screening mammogram for malignant neoplasm of breast: Secondary | ICD-10-CM | POA: Diagnosis not present

## 2019-06-05 DIAGNOSIS — Z0000 Encounter for general adult medical examination without abnormal findings: Secondary | ICD-10-CM | POA: Diagnosis not present

## 2019-06-07 ENCOUNTER — Telehealth: Payer: Self-pay | Admitting: Hematology and Oncology

## 2019-06-07 NOTE — Telephone Encounter (Signed)
Received a new hem referral from Brenda Drivers, PA from [place] for Elevated white blood cell count. Brenda Wilkinson has been cld and scheduled to see Dr. Rosine Door 5/5 at 345pm. Pt aware to arrive 15 minutes early.

## 2019-06-25 NOTE — Progress Notes (Signed)
[place] CONSULT NOTE  Patient Care Team: Lois Huxley, PA as PCP - General (Family Medicine)  CHIEF COMPLAINTS/PURPOSE OF CONSULTATION:  Newly diagnosed leukocytosis  HISTORY OF PRESENTING ILLNESS:  Brenda Wilkinson 49 y.o. female is here because of recent diagnosis of leukocytosis. She is referred by [facility]. Labs on 11/24/17 showed WBC 11.3, ANC 7.9, ALC 2.4, and on 05/30/19 showed WBC 12.3, ANC 8.4, ALC 2.8. She presents to the clinic today for initial evaluation.  Her only chronic health issues hypertension and obesity.  She tells me that she plans to lose weight and is planning to do that.  I reviewed her records extensively and collaborated the history with the patient.  MEDICAL HISTORY:  Hypertension Fibroids SURGICAL HISTORY: No prior history of surgery SOCIAL HISTORY: Denies any tobacco alcohol or recreational drug use FAMILY HISTORY: No family history of blood disorders No family history on file.  ALLERGIES:  is allergic to iohexol.  MEDICATIONS: Valsartan and amlodipine REVIEW OF SYSTEMS:   Constitutional: Denies fevers, chills or abnormal night sweats Eyes: Denies blurriness of vision, double vision or watery eyes Ears, nose, mouth, throat, and face: Denies mucositis or sore throat Respiratory: Denies cough, dyspnea or wheezes Cardiovascular: Denies palpitation, chest discomfort or lower extremity swelling Gastrointestinal:  Denies nausea, heartburn or change in bowel habits Skin: Denies abnormal skin rashes Lymphatics: Denies new lymphadenopathy or easy bruising Neurological:Denies numbness, tingling or new weaknesses Behavioral/Psych: Mood is stable, no new changes  Breast: Denies any palpable lumps or discharge All other systems were reviewed with the patient and are negative.  PHYSICAL EXAMINATION: ECOG PERFORMANCE STATUS: 0 - Asymptomatic  Vitals:   06/26/19 1548  BP: (!) 162/83  Pulse: (!) 101  Resp: 18  Temp: 98.5 F  (36.9 C)  SpO2: 100%   Filed Weights   06/26/19 1548  Weight: 291 lb 4.8 oz (132.1 kg)    GENERAL:alert, no distress and comfortable SKIN: skin color, texture, turgor are normal, no rashes or significant lesions EYES: normal, conjunctiva are pink and non-injected, sclera clear OROPHARYNX:no exudate, no erythema and lips, buccal mucosa, and tongue normal  NECK: supple, thyroid normal size, non-tender, without nodularity LYMPH:  no palpable lymphadenopathy in the cervical, axillary or inguinal LUNGS: clear to auscultation and percussion with normal breathing effort HEART: regular rate & rhythm and no murmurs and no lower extremity edema ABDOMEN:abdomen soft, non-tender and normal bowel sounds Musculoskeletal:no cyanosis of digits and no clubbing  PSYCH: alert & oriented x 3 with fluent speech NEURO: no focal motor/sensory deficits  LABORATORY DATA:  I have reviewed the data as listed No results found for: WBC, HGB, HCT, MCV, PLT No results found for: NA, K, CL, CO2  RADIOGRAPHIC STUDIES: I have personally reviewed the radiological reports and agreed with the findings in the report.  ASSESSMENT AND PLAN:  Leukocytosis Lab review: 11/24/17 showed WBC 11.3, ANC 7.9, ALC 2.4 05/30/19 showed WBC 12.3, ANC 8.4, ALC 2.8   Differential diagnosis of chronic neutrophilia 1. Inflammations: The only probable inflammation is gastritis 2. infections: Unlikely in her situation 3. Medications: Patient does not take any steroids 4. Bone marrow disorders: I do not suspect any bone marrow disorder because the rest of her blood counts are normal and it is primarily elevated neutrophils.  I discussed with her that since it is likely related to inflammation, there is no need of any additional lab testing at this time.  We discussed at length about obesity and how weight loss can  help improve her overall health. It would also help reduce inflammation. 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  We discussed how vitamin D can reduce inflammation. The most important thing is to exercise and eating more plant-based diet to reduce inflammation. I instructed her to call us back if she develops other cytopenias as well as further increase in the white blood cell count but the neutrophils are greater than 20.  Return to clinic on an as-needed basis.   All questions were answered. The patient knows to call the clinic with any problems, questions or concerns.   Rulon Eisenmenger, MD 06/26/2019    I, Molly Dorshimer, am acting as scribe for Nicholas Lose, MD.  I have reviewed the above documentation for accuracy and completeness, and I agree with the above.

## 2019-06-26 ENCOUNTER — Inpatient Hospital Stay: Payer: BC Managed Care – PPO | Attending: Hematology and Oncology | Admitting: Hematology and Oncology

## 2019-06-26 ENCOUNTER — Other Ambulatory Visit: Payer: Self-pay

## 2019-06-26 DIAGNOSIS — D72829 Elevated white blood cell count, unspecified: Secondary | ICD-10-CM | POA: Diagnosis not present

## 2019-06-26 DIAGNOSIS — I10 Essential (primary) hypertension: Secondary | ICD-10-CM | POA: Insufficient documentation

## 2019-06-26 DIAGNOSIS — Z79899 Other long term (current) drug therapy: Secondary | ICD-10-CM | POA: Diagnosis not present

## 2019-06-26 DIAGNOSIS — E669 Obesity, unspecified: Secondary | ICD-10-CM | POA: Diagnosis not present

## 2019-06-26 NOTE — Assessment & Plan Note (Signed)
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 Inflammations: The only probable inflammation is gastritis 2. infections: Unlikely in her situation 3. Medications: Patient does not take any steroids 4. Bone marrow disorders  Workup: 1. CBC with differential to evaluate the peripheral smear  2. flow cytometry for leukemia 3. BCR-ABL and JAK-2 mutation testing 4. CRP  If all the above tests are normal, we can see the patient on an as-needed basis. Return to clinic in 2 weeks to discuss the test results.

## 2019-07-08 DIAGNOSIS — Z1211 Encounter for screening for malignant neoplasm of colon: Secondary | ICD-10-CM | POA: Diagnosis not present

## 2019-07-08 DIAGNOSIS — K648 Other hemorrhoids: Secondary | ICD-10-CM | POA: Diagnosis not present

## 2020-10-07 NOTE — H&P (Addendum)
Patient Name Brenda Wilkinson, Brenda Wilkinson [id number], F) ID# [id number] Appt. Date/Time 10/05/2020 02:00PM DOB 01/19/1971 Service Dept. [address] Provider Velvia Mehrer Dominic Pea M.D. Insurance  Med Primary: [employer] (PP[place]) Insurance # : [id number] Policy/Group # : [id number] Employer Name : [employer] Prescription: [place] - Member is eligible. details Chief Complaint *GYN FOLLOW-UP  Pre-Op/10/12/2020/Laparoscopic Ablation of Uterine Fibroids with ACESSA @ [facility]. Fibroids with pressure and pain Wants Acessa rather than hysterectomy didnt take her anti HTN this weekend Patient's Care Team Primary Care Provider: Lois Huxley: [address], [place], [place] [address], Ph [phone], Fax [phone] NPI: [id number] Patient's Pharmacies [facility][id number]: [address] [place] [address] Ph [phone], Fax [phone] Wt: 290.8 lbs 10/05/2020 02:06 pm Ht: 5 ft 7.5 in 10/05/2020 02:06 pm BMI: 44.9 10/05/2020 02:06 pm BP: 164/72 10/05/2020 02:07 pm Allergies Allergies not reviewed (last reviewed 08/04/2020) NKDA Medications Reviewed Medications amLODIPine 5 mg tablet TAKE 1 TABLET BY MOUTH EVERY DAY 07/15/20   filled surescripts fluocinolone 0.01 % scalp oil and shower cap APPLY AT BEDTIME TO SCALP UP TO 2 WEEKS AS NEEDED ITCHING 09/07/20   filled surescripts Hailey 24 Fe 1 mg-20 mcg (24)/75 mg (4) tablet TAKE 1 TABLET BY MOUTH EVERY DAY 08/05/20   filled surescripts hydroCHLOROthiazide 25 mg tablet TAKE 1 TABLET BY MOUTH EVERY DAY IN THE MORNING FOR 90 DAYS 07/15/20   filled surescripts HYDROcodone 5 mg-acetaminophen 325 mg tablet Take 1 tablet(s) every 6 hours by oral route. 10/05/20   prescribed DLuz LexM.D. ibuprofen 800 mg tablet Take 1 tablet(s) 3 times a day by oral route as needed. 10/05/20   prescribed DLuz LexM.D. ketoconazole 2 % shampoo EVERY OTHER WEEK LATHER ON SCALP, LEAVE ON 8-10 MINUTES, RINSE WELL 09/07/20    filled surescripts losartan 100 mg tablet TAKE 1 TABLET BY MOUTH EVERY DAY FOR 90 DAYS 07/15/20   filled surescripts Microlet Lancet AS DIRECTED USE TO CHECK SUGARS ONCE A DAY 90 DAYS 04/02/18   filled surescripts Vaccines Vaccines not reviewed (last reviewed 08/04/2020) PFIZER COVID VACCINE X2. 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 Date of Last Mammogram: 06/04/2019. Date of Last Colonoscopy: 07/08/2019 (Notes: WNL/Medoff/Next colonoscopy in 10 years.). Date of Last Pap Smear: 04/13/2017. History of Abnormal PAP: Y (Notes: 12/17/92 COLPO/LGSIL). Sexually Active: Y. History of Sexually Transmitted Infection: N. Current Birth Control Method:: Combined Oral Contraceptive Pills. History of Fibroids: Y. Obstetric History Reviewed Obstetric History TOTAL FULL PRE AB. I AB. S ECTOPICS MULTIPLE LIVING 2    2   0 Past Medical History Reviewed Past Medical  History Cardiology- High  Blood Pressure: Y Screening None recorded. ROS Constitutional: Constitutional: no significant weight gain or loss and no fatigue or fever.  Skin: Skin: no rashes or abnormal moles.  Respiratory: Respiratory: no wheezing or dyspnea / shortness of breath.  Cardiovascular: Cardiovascular: no palpitations or chest pain.  Gastrointestinal: Gastrointestinal: no heartburn, dysphagia, nausea, vomiting, diarrhea, constipation, bowel movement changes, or rectal bleeding and abdominal pain.  Genitourinary: Genitourinary: no vaginal odor or itching; no hematuria, incontinence, rash, lesion, discharge, flank pain, or trouble urinating; and abnormal bleeding.  Endocrine: Menstrual: no menstrual problems or PMDD symptoms. Menopausal: no menopausal symptoms. Sexual: no sexual problems.  Musculoskeletal: Musculoskeletal: no muscle aches or weakness and no arthralgias/joint pain or back pain.  Neurological: Neurologic: no headaches, dizziness, LOC, weakness, or numbness.  Psychological: Psych: no depression, alcoholism, or sleep disturbances. Physical Exam Constitutional: *General Appearance: healthy-appearing, well-nourished, and well-developed.  Head: Head: normocephalic.  Neck: *Thyroid: non-tender and no enlargement.  Lymph Nodes: *Palpation: normal.  Cardiovascular: *Auscultation: RRR and no murmur. *Peripheral Vascular: no varicosities or edema.  Lungs: *Auscultation: clear to auscultation. Inspection: normal and normal respiratory rate.  *Breast: Bilateral: no tenderness, skin changes, abnormal nipple secretions, or masses palpable and nipple appearance: normal. Right Breast: normal. Left Breast: normal.  Abdomen: *Inspection/Palpation/Auscultation: no tenderness, rebound, guarding, hepatomegaly, or splenomegaly and non-distended and soft. *Hernia: none palpated.  Back: Appearance normal. Palpation no costovertebral angle tenderness.  Female Genitalia: Vulva:  no masses, atrophy, or lesions. Mons: no erythema, excoriation, atrophy, lesions, masses, swelling, tenderness, or vesicles/ ulcers and normal. Labia Majora: no erythema, excoriation, atrophy, discoloration, lesions, masses, swelling, tenderness, or vesicles/ ulcers and normal. Labia Minora: no erythema, excoriation, atrophy, discoloration, lesions, vesicles, masses, swelling, or tenderness and normal. Introitus: normal. Bartholin's Gland: normal. *Vagina: no discharge, erythema, atrophy, lesions, ulcers, swelling, masses, tenderness, prolapse, or blood present and normal. *Cervix: no lesions, discharge, bleeding, or cervical motion tenderness and grossly normal. *Uterus: fibroids and enlarged 12 weeks size and midline, mobile, non-tender, normal contour, and no uterine prolapse. *Urethral Meatus/ Urethra: no discharge, masses, or tenderness and normal meatus and well supported urethra. *Bladder: non-distended, non-tender, and no palpable mass. *Adnexa/Parametria: no tenderness or mass palpable.  Extremities: Legs: normal.  Skin: *Appearance: no rashes or lesions.  Psychiatric: *Orientation: to person, place, and time. *Mood and Affect: normal mood and affect and active and alert. Assessment / Plan 1. Postoperative pain - gave rx for post op pain G89.18: Other acute postprocedural pain ibuprofen 800 mg tablet - Take 1 tablet(s) 3 times a day by oral route as needed.     Qty: 20 tablet(s)     Refills: 0     [id number] 5 mg-acetaminophen 325 mg tablet - To be submitted on or around 10/06/2020     Take 1 tablet(s) every 6 hours by oral route.     Qty: 10 tablet(s)     Refills: 0     [id number]. Uterine leiomyoma - Interval growth in fibroids. Now 12 weeks size largest fibroid has grown from 8cm to 10.2 and the smaller fibroid from 2.3cm to 4.3 I rec surgical treatment with either Hysterectomy (TAH) or laparoscopic ablation (AAdah PerlShe would rather  proceed with Acessa at this time. Discussed the risks and benefits of the procedure including but not limited to infection, bleeding, damage to bowel, bladder, ureters, and risks associated with blood transfusion. We reviewed the procedure at length including the recovery. All here questions were answered and she gives informed consent. D25.1:  Intramural leiomyoma of uterus  3. Pain in pelvis - pressure and pain due to large fibroids R10.2: Pelvic and perineal pain   10/12/20 0715 This patient has been seen and examined.   All of her questions were answered.  Labs and vital signs reviewed.  Informed consent has been obtained.  The History and Physical is current.

## 2020-10-09 ENCOUNTER — Other Ambulatory Visit: Payer: Self-pay

## 2020-10-09 ENCOUNTER — Encounter (HOSPITAL_BASED_OUTPATIENT_CLINIC_OR_DEPARTMENT_OTHER): Payer: Self-pay | Admitting: Obstetrics and Gynecology

## 2020-10-09 NOTE — Progress Notes (Signed)
Spoke w/ via phone for pre-op interview---PT Lab needs dos----   CBC,BMET,Type/screen, EKG, Urine PCOT            Lab results------none COVID test -----patient states asymptomatic no test needed Arrive at -------0530 NPO after MN NO Solid Food.  Clear liquids from MN until---0430 Med rec completed- yes Medications to take morning of surgery -----Losartan, amlodipine  Diabetic medication -----none Patient instructed no nail polish to be worn day of surgery Patient instructed to bring photo id and insurance card day of surgery Patient aware to have Driver (ride ) / Husband caregiver    for 24 hours after surgery  Patient Special Instructions -----none Pre-Op special Istructions -----none Patient verbalized understanding of instructions that were given at this phone interview. Patient denies shortness of breath, chest pain, fever, cough at this phone interview.

## 2020-10-12 ENCOUNTER — Encounter (HOSPITAL_BASED_OUTPATIENT_CLINIC_OR_DEPARTMENT_OTHER)
Admission: RE | Disposition: A | Discharge status: Home / Self Care | Payer: Self-pay | Source: Home / Self Care | Attending: Obstetrics and Gynecology

## 2020-10-12 ENCOUNTER — Encounter (HOSPITAL_BASED_OUTPATIENT_CLINIC_OR_DEPARTMENT_OTHER): Payer: Self-pay | Admitting: Obstetrics and Gynecology

## 2020-10-12 ENCOUNTER — Ambulatory Visit (HOSPITAL_BASED_OUTPATIENT_CLINIC_OR_DEPARTMENT_OTHER): Payer: BC Managed Care – PPO | Admitting: Anesthesiology

## 2020-10-12 ENCOUNTER — Ambulatory Visit (HOSPITAL_BASED_OUTPATIENT_CLINIC_OR_DEPARTMENT_OTHER)
Admission: RE | Admit: 2020-10-12 | Discharge: 2020-10-12 | Disposition: A | Discharge status: Home / Self Care | Payer: BC Managed Care – PPO | Attending: Obstetrics and Gynecology | Admitting: Obstetrics and Gynecology

## 2020-10-12 DIAGNOSIS — N920 Excessive and frequent menstruation with regular cycle: Secondary | ICD-10-CM | POA: Insufficient documentation

## 2020-10-12 DIAGNOSIS — Z791 Long term (current) use of non-steroidal anti-inflammatories (NSAID): Secondary | ICD-10-CM | POA: Insufficient documentation

## 2020-10-12 DIAGNOSIS — E669 Obesity, unspecified: Secondary | ICD-10-CM | POA: Insufficient documentation

## 2020-10-12 DIAGNOSIS — D251 Intramural leiomyoma of uterus: Secondary | ICD-10-CM | POA: Diagnosis present

## 2020-10-12 DIAGNOSIS — Z6841 Body Mass Index (BMI) 40.0 and over, adult: Secondary | ICD-10-CM | POA: Diagnosis not present

## 2020-10-12 DIAGNOSIS — R102 Pelvic and perineal pain: Secondary | ICD-10-CM | POA: Insufficient documentation

## 2020-10-12 HISTORY — DX: Family history of other specified conditions: Z84.89

## 2020-10-12 HISTORY — DX: Essential (primary) hypertension: I10

## 2020-10-12 LAB — TYPE AND SCREEN
ABO/RH(D): O POS
Antibody Screen: NEGATIVE

## 2020-10-12 LAB — BASIC METABOLIC PANEL
Anion gap: 12 (ref 5–15)
BUN: 19 mg/dL (ref 6–20)
CO2: 24 mmol/L (ref 22–32)
Calcium: 9.5 mg/dL (ref 8.9–10.3)
Chloride: 104 mmol/L (ref 98–111)
Creatinine, Ser: 0.94 mg/dL (ref 0.44–1.00)
GFR, Estimated: >60 mL/min (ref >60)
Glucose, Bld: 115 mg/dL — ABNORMAL HIGH (ref 70–99)
Potassium: 3.5 mmol/L (ref 3.5–5.1)
Sodium: 140 mmol/L (ref 135–145)

## 2020-10-12 LAB — CBC
HCT: 40.7 % (ref 36.0–46.0)
Hemoglobin: 12.8 g/dL (ref 12.0–15.0)
MCH: 26.4 pg (ref 26.0–34.0)
MCHC: 31.4 g/dL (ref 30.0–36.0)
MCV: 83.9 fL (ref 80.0–100.0)
Platelets: 394 10*3/uL (ref 150–400)
RBC: 4.85 MIL/uL (ref 3.87–5.11)
RDW: 15 % (ref 11.5–15.5)
WBC: 10.5 10*3/uL (ref 4.0–10.5)
nRBC: 0 % (ref 0.0–0.2)

## 2020-10-12 LAB — POCT PREGNANCY, URINE: Preg Test, Ur: NEGATIVE

## 2020-10-12 LAB — ABO/RH: ABO/RH(D): O POS

## 2020-10-12 SURGERY — Laparoscopic Ablation of Uterine Fibroids with Acessa
Anesthesia: General | Site: Abdomen

## 2020-10-12 MED ORDER — FENTANYL CITRATE (PF) 100 MCG/2ML IJ SOLN
25.0000 ug | INTRAMUSCULAR | Status: DC | PRN
  Administered 2020-10-12: 25 ug via INTRAVENOUS

## 2020-10-12 MED ORDER — ONDANSETRON HCL 4 MG/2ML IJ SOLN
INTRAMUSCULAR | Status: DC | PRN
  Administered 2020-10-12: 4 mg via INTRAVENOUS

## 2020-10-12 MED ORDER — OXYCODONE HCL 5 MG PO TABS
ORAL_TABLET | ORAL | Status: AC
  Filled 2020-10-12: qty 1

## 2020-10-12 MED ORDER — ARTIFICIAL TEARS OPHTHALMIC OINT
TOPICAL_OINTMENT | OPHTHALMIC | Status: AC
  Filled 2020-10-12: qty 3.5

## 2020-10-12 MED ORDER — FENTANYL CITRATE (PF) 100 MCG/2ML IJ SOLN
INTRAMUSCULAR | Status: DC | PRN
  Administered 2020-10-12: 50 ug via INTRAVENOUS
  Administered 2020-10-12: 100 ug via INTRAVENOUS

## 2020-10-12 MED ORDER — DEXAMETHASONE SODIUM PHOSPHATE 10 MG/ML IJ SOLN
INTRAMUSCULAR | Status: AC
  Filled 2020-10-12: qty 1

## 2020-10-12 MED ORDER — KETOROLAC TROMETHAMINE 30 MG/ML IJ SOLN
INTRAMUSCULAR | Status: DC | PRN
  Administered 2020-10-12: 30 mg via INTRAVENOUS

## 2020-10-12 MED ORDER — 0.9 % SODIUM CHLORIDE (POUR BTL) OPTIME
TOPICAL | Status: DC | PRN
Start: 2020-10-12 — End: 2020-10-12
  Administered 2020-10-12: 500 mL

## 2020-10-12 MED ORDER — FENTANYL CITRATE (PF) 100 MCG/2ML IJ SOLN
INTRAMUSCULAR | Status: AC
  Filled 2020-10-12: qty 2

## 2020-10-12 MED ORDER — GLYCOPYRROLATE PF 0.2 MG/ML IJ SOSY
PREFILLED_SYRINGE | INTRAMUSCULAR | Status: AC
  Filled 2020-10-12: qty 1

## 2020-10-12 MED ORDER — ROCURONIUM BROMIDE 10 MG/ML (PF) SYRINGE
PREFILLED_SYRINGE | INTRAVENOUS | Status: DC | PRN
  Administered 2020-10-12: 80 mg via INTRAVENOUS
  Administered 2020-10-12: 20 mg via INTRAVENOUS

## 2020-10-12 MED ORDER — ONDANSETRON HCL 4 MG/2ML IJ SOLN
INTRAMUSCULAR | Status: AC
  Filled 2020-10-12: qty 2

## 2020-10-12 MED ORDER — BUPIVACAINE HCL (PF) 0.25 % IJ SOLN
INTRAMUSCULAR | Status: DC | PRN
  Administered 2020-10-12: 10 mL

## 2020-10-12 MED ORDER — OXYCODONE HCL 5 MG/5ML PO SOLN: 5.0000 mg | Freq: Once | ORAL | Status: AC | PRN

## 2020-10-12 MED ORDER — SODIUM CHLORIDE 0.9 % IV SOLN
INTRAVENOUS | Status: AC
  Filled 2020-10-12: qty 2

## 2020-10-12 MED ORDER — MIDAZOLAM HCL 2 MG/2ML IJ SOLN
INTRAMUSCULAR | Status: AC
  Filled 2020-10-12: qty 2

## 2020-10-12 MED ORDER — PHENYLEPHRINE 40 MCG/ML (10ML) SYRINGE FOR IV PUSH (FOR BLOOD PRESSURE SUPPORT)
PREFILLED_SYRINGE | INTRAVENOUS | Status: AC
  Filled 2020-10-12: qty 10

## 2020-10-12 MED ORDER — DEXAMETHASONE SODIUM PHOSPHATE 10 MG/ML IJ SOLN
INTRAMUSCULAR | Status: DC | PRN
  Administered 2020-10-12: 10 mg via INTRAVENOUS

## 2020-10-12 MED ORDER — ACETAMINOPHEN 500 MG PO TABS: 1000.0000 mg | ORAL_TABLET | Freq: Once | ORAL | Status: DC | PRN

## 2020-10-12 MED ORDER — SODIUM CHLORIDE 0.9 % IV SOLN
2.0000 g | INTRAVENOUS | Status: AC
  Administered 2020-10-12: 2 g via INTRAVENOUS

## 2020-10-12 MED ORDER — ACETAMINOPHEN 10 MG/ML IV SOLN: 1000.0000 mg | Freq: Once | INTRAVENOUS | Status: DC | PRN

## 2020-10-12 MED ORDER — POVIDONE-IODINE 10 % EX SWAB: 2.0000 "application " | Freq: Once | CUTANEOUS | Status: DC

## 2020-10-12 MED ORDER — LACTATED RINGERS IV SOLN: INTRAVENOUS | Status: DC

## 2020-10-12 MED ORDER — MIDAZOLAM HCL 5 MG/5ML IJ SOLN
INTRAMUSCULAR | Status: DC | PRN
  Administered 2020-10-12: 2 mg via INTRAVENOUS

## 2020-10-12 MED ORDER — PROPOFOL 10 MG/ML IV BOLUS
INTRAVENOUS | Status: DC | PRN
  Administered 2020-10-12: 130 mg via INTRAVENOUS

## 2020-10-12 MED ORDER — SUGAMMADEX SODIUM 200 MG/2ML IV SOLN
INTRAVENOUS | Status: DC | PRN
  Administered 2020-10-12: 200 mg via INTRAVENOUS

## 2020-10-12 MED ORDER — PHENYLEPHRINE 40 MCG/ML (10ML) SYRINGE FOR IV PUSH (FOR BLOOD PRESSURE SUPPORT)
PREFILLED_SYRINGE | INTRAVENOUS | Status: DC | PRN
  Administered 2020-10-12 (×3): 80 ug via INTRAVENOUS
  Administered 2020-10-12: 40 ug via INTRAVENOUS

## 2020-10-12 MED ORDER — LIDOCAINE HCL (PF) 2 % IJ SOLN
INTRAMUSCULAR | Status: AC
  Filled 2020-10-12: qty 5

## 2020-10-12 MED ORDER — PROPOFOL 10 MG/ML IV BOLUS
INTRAVENOUS | Status: AC
  Filled 2020-10-12: qty 20

## 2020-10-12 MED ORDER — ACETAMINOPHEN 160 MG/5ML PO SOLN: 1000.0000 mg | Freq: Once | ORAL | Status: DC | PRN

## 2020-10-12 MED ORDER — GLYCOPYRROLATE 0.2 MG/ML IJ SOLN
INTRAMUSCULAR | Status: DC | PRN
  Administered 2020-10-12: .2 mg via INTRAVENOUS

## 2020-10-12 MED ORDER — LIDOCAINE 2% (20 MG/ML) 5 ML SYRINGE
INTRAMUSCULAR | Status: DC | PRN
  Administered 2020-10-12: 80 mg via INTRAVENOUS
  Administered 2020-10-12: 60 mg via INTRAVENOUS

## 2020-10-12 MED ORDER — OXYCODONE HCL 5 MG PO TABS
5.0000 mg | ORAL_TABLET | Freq: Once | ORAL | Status: AC | PRN
  Administered 2020-10-12: 5 mg via ORAL

## 2020-10-12 SURGICAL SUPPLY — 34 items
ADH SKN CLS APL DERMABOND .7 (GAUZE/BANDAGES/DRESSINGS) ×1
CABLE HIGH FREQUENCY MONO STRZ (ELECTRODE) IMPLANT
CATH ROBINSON RED A/P 16FR (CATHETERS) ×2 IMPLANT
COVER MAYO STAND STRL (DRAPES) ×2 IMPLANT
DERMABOND ADVANCED (GAUZE/BANDAGES/DRESSINGS) ×1
DERMABOND ADVANCED .7 DNX12 (GAUZE/BANDAGES/DRESSINGS) IMPLANT
DRSG OPSITE POSTOP 3X4 (GAUZE/BANDAGES/DRESSINGS) IMPLANT
DURAPREP 26ML APPLICATOR (WOUND CARE) ×1 IMPLANT
GAUZE 4X4 16PLY ~~LOC~~+RFID DBL (SPONGE) ×2 IMPLANT
GLOVE SURG ORTHO LTX SZ8 (GLOVE) ×4 IMPLANT
GLOVE SURG UNDER POLY LF SZ6.5 (GLOVE) ×2 IMPLANT
GLOVE SURG UNDER POLY LF SZ7 (GLOVE) ×5 IMPLANT
GLOVE SURG UNDER POLY LF SZ7.5 (GLOVE) ×1 IMPLANT
GOWN STRL REUS W/TWL LRG LVL3 (GOWN DISPOSABLE) ×5 IMPLANT
KIT TURNOVER CYSTO (KITS) ×2 IMPLANT
LIGASURE LAP L-HOOKWIRE 5 44CM (INSTRUMENTS) IMPLANT
MANIPULATOR UTERINE 4.5 ZUMI (MISCELLANEOUS) ×2 IMPLANT
NEEDLE INSUFFLATION 120MM (ENDOMECHANICALS) ×2 IMPLANT
NS IRRIG 500ML POUR BTL (IV SOLUTION) ×2 IMPLANT
PACK LAPAROSCOPY BASIN (CUSTOM PROCEDURE TRAY) ×2 IMPLANT
PACK TRENDGUARD 450 HYBRID PRO (MISCELLANEOUS) ×1 IMPLANT
PAD GROUNDING ACESSA PRO VU (MISCELLANEOUS) ×2 IMPLANT
PAD PREP 24X48 CUFFED NSTRL (MISCELLANEOUS) ×2 IMPLANT
SCISSORS LAP 5X35 DISP (ENDOMECHANICALS) IMPLANT
SET HANDPIECE ACESSA GROUNDING (MISCELLANEOUS) ×2 IMPLANT
SET SUCTION IRRIG HYDROSURG (IRRIGATION / IRRIGATOR) IMPLANT
SET TUBE SMOKE EVAC HIGH FLOW (TUBING) ×2 IMPLANT
SUT VICRYL 0 UR6 27IN ABS (SUTURE) ×2 IMPLANT
SUT VICRYL RAPIDE 3 0 (SUTURE) ×2 IMPLANT
TOWEL OR 17X26 10 PK STRL BLUE (TOWEL DISPOSABLE) ×2 IMPLANT
TRENDGUARD 450 HYBRID PRO PACK (MISCELLANEOUS) ×2
TROCAR BLADELESS OPT 5 100 (ENDOMECHANICALS) ×1 IMPLANT
TROCAR XCEL DIL TIP R 11M (ENDOMECHANICALS) ×2 IMPLANT
WARMER LAPAROSCOPE (MISCELLANEOUS) ×2 IMPLANT

## 2020-10-12 NOTE — Op Note (Signed)
Brenda Wilkinson, LEEZER MEDICAL RECORD NO: [id number] ACCOUNT NO: [id number] DATE OF BIRTH: [birth year] FACILITY: [facility] LOCATION: [facility] PHYSICIAN: Monia Sabal. Corinna Capra, MD  Operative Report   DATE OF PROCEDURE: 10/12/2020  PREOPERATIVE DIAGNOSES: 1.  Large uterine fibroids. 2.  Pelvic pain and pressure. 3.  Menorrhagia.  POSTOPERATIVE DIAGNOSES: 1.  Large uterine fibroids. 2.  Pelvic pain and pressure. 3.  Menorrhagia.  PROCEDURE:  Laparoscopic uterine fibroid ablation with Acessa.  SURGEON:  Monia Sabal. Corinna Capra, MD  ANESTHESIA:  General endotracheal.  INDICATIONS:  The patient is a 50 year old black female with 12-week size uterine fibroids with the largest one measuring 10.2 cm in size, second measuring 4.3 cm in size.  She is having pelvic pain and pressure and menorrhagia, which she is currently on  birth control pills for this.  She desires surgical intervention.  She does not want a hysterectomy, but would prefer laparoscopic ablation with the Acessa procedure.  We discussed the procedure at length, the risks and benefits, pros and cons.  All of  her questions were answered and she was given informed consent.  FINDINGS: At the time of surgery was 12-week size fibroid uterus with 2 fibroids previously seen, one 10.2 cm in size, the other 4.3.  Normal-appearing liver, gallbladder. Fallopian tubes and ovaries appeared normal as well.  DESCRIPTION OF PROCEDURE:  After adequate analgesia, the patient was placed in the dorsal lithotomy position, sterilely prepped and draped.  Bladder was sterilely drained.  A Graves speculum was placed.  A HUMI tenaculum was placed on the cervix.  A 1 cm  infraumbilical skin incision was made.  A Veress needle was inserted.  The abdomen was insufflated to dullness to percussion.  An 11 mm trocar was inserted, and a 5 mm scope was inserted through the rectum ensuring proper placement, 6 cm above that site  in the midline, a 5 mm port was placed, but the  trocar inserted with a Visiport with the 5 mm camera and good placement noted. The Acessa ultrasound probe was inserted through the umbilical incision and scanned across the uterus, clearly showed the 10.2  and the 4.3 cm fibroids.  The Acessa device was then inserted to the right of the umbilicus under direct visualization by the laparoscope. It was then inserted under direct visualization.  First, we ablated the 4.3 cm fibroid with 2 deployments with  excellent thermal cautery effect noted by ultrasound and care taken to insert in the correct place with no obvious injuries to bowel or bladder. After repositioning, we carefully evaluated the 10.2 cm fibroid.  At 3 different insertion sites, we were  able to deploy and activate the different quadrants of the fibroid with good thermal cautery effect noted by ultrasound, care taken to stay within the capsule of the fibroid and avoid any bowel or bladder injury.  There were 6 more treatments of this  larger fibroid with a total time of 18 minutes on the large fibroid and 4 minutes on the smaller fibroid. After all the fibroids had been carefully ablated, good hemostasis appeared to be achieved.  The laparoscope and the devices were removed and the  trocars were removed.  The infraumbilical skin incision was closed with a 0 Vicryl interrupted suture in the fascia, 3-0 Vicryl Rapide subcuticular suture.  The 5 mm site was closed with 3-0 Vicryl Rapide subcuticular suture and the incision was closed  with Dermabond.  The HUMI retractor was removed with good hemostasis noted.  The patient was transferred to  recovery room in stable condition.  Sponge and instrument count was normal x3.  Estimated blood loss was minimal.  The patient received 2 grams of  cefotetan preoperatively.  DISPOSITION:  The patient will be discharged home with followup in the office in 2-3 weeks.  Sent her with routine instruction sheet for laparoscopy.  Also, has a prescription for Vicodin  and Motrin at home.   PAA D: 10/12/2020 9:34:11 am T: 10/12/2020 10:12:00 am  JOB: [id number]

## 2020-10-12 NOTE — Anesthesia Preprocedure Evaluation (Addendum)
Anesthesia Evaluation  Patient identified by MRN, date of birth, ID band Patient awake    Reviewed: Allergy & Precautions, NPO status , Patient's Chart, lab work & pertinent test results  History of Anesthesia Complications Negative for: history of anesthetic complications  Airway Mallampati: III  TM Distance: >3 FB Neck ROM: Full    Dental  (+) Dental Advisory Given, Teeth Intact   Pulmonary neg pulmonary ROS, neg shortness of breath, neg sleep apnea, neg COPD, neg recent URI,  Covid-19 Nucleic Acid Test Results No results found for: SARSCOV2NAA, SARSCOV2    breath sounds clear to auscultation       Cardiovascular hypertension, Pt. on medications (-) angina(-) Past MI and (-) CHF (-) dysrhythmias  Rhythm:Regular     Neuro/Psych negative neurological ROS  negative psych ROS   GI/Hepatic negative GI ROS, Neg liver ROS,   Endo/Other  Morbid obesity  Renal/GU negative Renal ROS     Musculoskeletal negative musculoskeletal ROS (+)   Abdominal   Peds  Hematology negative hematology ROS (+) No results found for: WBC, HGB, HCT, MCV, PLT    Anesthesia Other Findings   Reproductive/Obstetrics negative OB ROS Lab Results      Component                Value               Date                      PREGTESTUR               NEGATIVE            10/12/2020                                       Anesthesia Physical Anesthesia Plan  ASA: 3  Anesthesia Plan: General   Post-op Pain Management:    Induction: Intravenous  PONV Risk Score and Plan: 3 and Ondansetron and Dexamethasone  Airway Management Planned: Oral ETT  Additional Equipment: None  Intra-op Plan:   Post-operative Plan: Extubation in OR  Informed Consent: I have reviewed the patients History and Physical, chart, labs and discussed the procedure including the risks, benefits and alternatives for the proposed anesthesia with the  patient or authorized representative who has indicated his/her understanding and acceptance.     Dental advisory given  Plan Discussed with: CRNA and Anesthesiologist  Anesthesia Plan Comments:         Anesthesia Quick Evaluation

## 2020-10-12 NOTE — Anesthesia Procedure Notes (Signed)
Procedure Name: Intubation Date/Time: 10/12/2020 7:40 AM Performed by: Rogers Blocker, CRNA Pre-anesthesia Checklist: Patient identified, Emergency Drugs available, Suction available and Patient being monitored Patient Re-evaluated:Patient Re-evaluated prior to induction Oxygen Delivery Method: Circle System Utilized Preoxygenation: Pre-oxygenation with 100% oxygen Induction Type: IV induction Ventilation: Mask ventilation without difficulty Laryngoscope Size: Mac and 3 Grade View: Grade I Tube type: Oral Number of attempts: 1 Airway Equipment and Method: Stylet Placement Confirmation: ETT inserted through vocal cords under direct vision, positive ETCO2 and breath sounds checked- equal and bilateral Secured at: 22 cm Tube secured with: Tape Dental Injury: Teeth and Oropharynx as per pre-operative assessment

## 2020-10-12 NOTE — Anesthesia Postprocedure Evaluation (Signed)
Anesthesia Post Note  Patient: Brenda Wilkinson  Procedure(s) Performed: Laparoscopic Ablation of Uterine Fibroids with Acessa (Abdomen)     Patient location during evaluation: PACU Anesthesia Type: General Level of consciousness: awake and alert Pain management: pain level controlled Vital Signs Assessment: post-procedure vital signs reviewed and stable Respiratory status: spontaneous breathing, nonlabored ventilation, respiratory function stable and patient connected to nasal cannula oxygen Cardiovascular status: blood pressure returned to baseline and stable Postop Assessment: no apparent nausea or vomiting Anesthetic complications: no   No notable events documented.  Last Vitals:  Vitals:   10/12/20 1025 10/12/20 1045  BP:  (!) 99/56  Pulse:  67  Resp: 20 16  Temp:  36.4 C  SpO2:  98%    Last Pain:  Vitals:   10/12/20 1045  TempSrc:   PainSc: 7                  Maddox Bratcher

## 2020-10-12 NOTE — Discharge Instructions (Addendum)

## 2020-10-12 NOTE — Transfer of Care (Signed)
Immediate Anesthesia Transfer of Care Note  Patient: Brenda Wilkinson  Procedure(s) Performed: Laparoscopic Ablation of Uterine Fibroids with Acessa (Abdomen)  Patient Location: PACU  Anesthesia Type:General  Level of Consciousness: drowsy, patient cooperative and responds to stimulation  Airway & Oxygen Therapy: Patient Spontanous Breathing and Patient connected to face mask oxygen  Post-op Assessment: Report given to RN and Post -op Vital signs reviewed and stable  Post vital signs: Reviewed and stable  Last Vitals:  Vitals Value Taken Time  BP 114/63 10/12/20 0930  Temp    Pulse 84 10/12/20 0933  Resp 26 10/12/20 0933  SpO2 96 % 10/12/20 0933  Vitals shown include unvalidated device data.  Last Pain:  Vitals:   10/12/20 0604  TempSrc: Oral  PainSc: 0-No pain      Patients Stated Pain Goal: 2 ([id number])  Complications: No notable events documented.

## 2024-01-08 ENCOUNTER — Other Ambulatory Visit: Payer: Self-pay | Admitting: Obstetrics and Gynecology

## 2024-01-08 DIAGNOSIS — R928 Other abnormal and inconclusive findings on diagnostic imaging of breast: Secondary | ICD-10-CM

## 2024-01-25 ENCOUNTER — Ambulatory Visit

## 2024-01-25 ENCOUNTER — Ambulatory Visit
Admission: RE | Admit: 2024-01-25 | Discharge: 2024-01-25 | Disposition: A | Source: Ambulatory Visit | Attending: Obstetrics and Gynecology | Admitting: Obstetrics and Gynecology

## 2024-01-25 DIAGNOSIS — R928 Other abnormal and inconclusive findings on diagnostic imaging of breast: Secondary | ICD-10-CM
